# Patient Record
Sex: Female | Born: 1975 | Hispanic: Yes | Marital: Single | Smoking: Never smoker
Health system: Southern US, Community
[De-identification: ages and names within clinical notes are randomized; demographics above are authoritative.]

## PROBLEM LIST (undated history)

## (undated) DIAGNOSIS — S82202A Unspecified fracture of shaft of left tibia, initial encounter for closed fracture: Secondary | ICD-10-CM

## (undated) DIAGNOSIS — S82832A Other fracture of upper and lower end of left fibula, initial encounter for closed fracture: Secondary | ICD-10-CM

## (undated) DIAGNOSIS — S82872A Displaced pilon fracture of left tibia, initial encounter for closed fracture: Secondary | ICD-10-CM

---

## 2016-10-21 ENCOUNTER — Emergency Department (HOSPITAL_COMMUNITY): Payer: Medicaid Other

## 2016-10-21 ENCOUNTER — Encounter (HOSPITAL_COMMUNITY): Payer: Self-pay

## 2016-10-21 ENCOUNTER — Emergency Department (HOSPITAL_COMMUNITY)
Admission: EM | Admit: 2016-10-21 | Discharge: 2016-10-22 | Disposition: A | Discharge status: Home / Self Care | Payer: Medicaid Other | Attending: Emergency Medicine | Admitting: Emergency Medicine

## 2016-10-21 DIAGNOSIS — Y9355 Activity, bike riding: Secondary | ICD-10-CM | POA: Insufficient documentation

## 2016-10-21 DIAGNOSIS — Y929 Unspecified place or not applicable: Secondary | ICD-10-CM | POA: Diagnosis not present

## 2016-10-21 DIAGNOSIS — Y999 Unspecified external cause status: Secondary | ICD-10-CM | POA: Diagnosis not present

## 2016-10-21 DIAGNOSIS — S8992XA Unspecified injury of left lower leg, initial encounter: Secondary | ICD-10-CM | POA: Diagnosis present

## 2016-10-21 DIAGNOSIS — S82202A Unspecified fracture of shaft of left tibia, initial encounter for closed fracture: Secondary | ICD-10-CM | POA: Insufficient documentation

## 2016-10-21 DIAGNOSIS — S82402A Unspecified fracture of shaft of left fibula, initial encounter for closed fracture: Secondary | ICD-10-CM | POA: Diagnosis not present

## 2016-10-21 MED ORDER — OXYCODONE-ACETAMINOPHEN 5-325 MG PO TABS
2.0000 | ORAL_TABLET | Freq: Once | ORAL | Status: AC
  Administered 2016-10-22: 2 via ORAL
  Filled 2016-10-21: qty 2

## 2016-10-21 MED ORDER — FENTANYL CITRATE (PF) 100 MCG/2ML IJ SOLN
50.0000 ug | INTRAMUSCULAR | Status: DC | PRN
  Administered 2016-10-21: 50 ug via INTRAVENOUS
  Filled 2016-10-21: qty 2

## 2016-10-21 MED ORDER — OXYCODONE-ACETAMINOPHEN 5-325 MG PO TABS: 1.0000 | ORAL_TABLET | ORAL | 0 refills | Status: DC | PRN

## 2016-10-21 NOTE — ED Notes (Signed)
Bed: [id number] Expected date:  Expected time:  Means of arrival:  Comments: [age] f with fx and deformity
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
## 2016-10-21 NOTE — Discharge Instructions (Signed)
Do not bear weight on your left leg. Deep leg elevated to reduce swelling. Follow up with Dr. Valentina GuLucy in his clinic at 7 AM on Tuesday. Return immediately for uncontrolled pain, numbness or for any concerns.

## 2016-10-22 MED ORDER — OXYCODONE-ACETAMINOPHEN 5-325 MG PO TABS: 1.0000 | ORAL_TABLET | ORAL | 0 refills | Status: DC | PRN

## 2016-10-24 ENCOUNTER — Encounter (HOSPITAL_COMMUNITY): Payer: Self-pay | Admitting: *Deleted

## 2016-10-24 NOTE — Progress Notes (Signed)
I completed SDW call using [employer] interpreter, [place], [place] # [id number].

## 2016-10-26 ENCOUNTER — Encounter (HOSPITAL_COMMUNITY): Payer: Self-pay | Admitting: General Practice

## 2016-10-26 ENCOUNTER — Encounter (HOSPITAL_COMMUNITY)
Admission: RE | Disposition: A | Discharge status: Home Health | Payer: Self-pay | Source: Ambulatory Visit | Attending: Orthopedic Surgery

## 2016-10-26 ENCOUNTER — Inpatient Hospital Stay (HOSPITAL_COMMUNITY): Payer: Self-pay | Admitting: Anesthesiology

## 2016-10-26 ENCOUNTER — Inpatient Hospital Stay (HOSPITAL_COMMUNITY)
Admission: RE | Admit: 2016-10-26 | Discharge: 2016-10-28 | DRG: 493 | Disposition: A | Discharge status: Home Health | Payer: Self-pay | Source: Ambulatory Visit | Attending: Orthopedic Surgery | Admitting: Orthopedic Surgery

## 2016-10-26 ENCOUNTER — Inpatient Hospital Stay (HOSPITAL_COMMUNITY): Payer: Self-pay

## 2016-10-26 DIAGNOSIS — Z419 Encounter for procedure for purposes other than remedying health state, unspecified: Secondary | ICD-10-CM

## 2016-10-26 DIAGNOSIS — S82832A Other fracture of upper and lower end of left fibula, initial encounter for closed fracture: Secondary | ICD-10-CM

## 2016-10-26 DIAGNOSIS — T148XXA Other injury of unspecified body region, initial encounter: Secondary | ICD-10-CM

## 2016-10-26 DIAGNOSIS — S82872A Displaced pilon fracture of left tibia, initial encounter for closed fracture: Principal | ICD-10-CM | POA: Diagnosis present

## 2016-10-26 DIAGNOSIS — S82202A Unspecified fracture of shaft of left tibia, initial encounter for closed fracture: Secondary | ICD-10-CM

## 2016-10-26 DIAGNOSIS — D62 Acute posthemorrhagic anemia: Secondary | ICD-10-CM | POA: Diagnosis not present

## 2016-10-26 HISTORY — DX: Unspecified fracture of shaft of left tibia, initial encounter for closed fracture: S82.202A

## 2016-10-26 HISTORY — DX: Other fracture of upper and lower end of left fibula, initial encounter for closed fracture: S82.832A

## 2016-10-26 HISTORY — DX: Displaced pilon fracture of left tibia, initial encounter for closed fracture: S82.832A

## 2016-10-26 HISTORY — DX: Displaced pilon fracture of left tibia, initial encounter for closed fracture: S82.872A

## 2016-10-26 HISTORY — PX: TIBIA IM NAIL INSERTION: SHX2516

## 2016-10-26 HISTORY — PX: ORIF TIBIA FRACTURE: SHX5416

## 2016-10-26 LAB — CBC WITH DIFFERENTIAL/PLATELET
BASOS PCT: 0 %
Basophils Absolute: 0 10*3/uL (ref 0.0–0.1)
Eosinophils Absolute: 0.7 10*3/uL (ref 0.0–0.7)
Eosinophils Relative: 6 %
HEMATOCRIT: 38.1 % (ref 36.0–46.0)
HEMOGLOBIN: 12.5 g/dL (ref 12.0–15.0)
LYMPHS ABS: 2.1 10*3/uL (ref 0.7–4.0)
LYMPHS PCT: 18 %
MCH: 26.8 pg (ref 26.0–34.0)
MCHC: 32.8 g/dL (ref 30.0–36.0)
MCV: 81.8 fL (ref 78.0–100.0)
MONO ABS: 0.7 10*3/uL (ref 0.1–1.0)
MONOS PCT: 6 %
NEUTROS ABS: 8.3 10*3/uL — AB (ref 1.7–7.7)
NEUTROS PCT: 70 %
Platelets: 364 10*3/uL (ref 150–400)
RBC: 4.66 MIL/uL (ref 3.87–5.11)
RDW: 13.7 % (ref 11.5–15.5)
WBC: 11.8 10*3/uL — ABNORMAL HIGH (ref 4.0–10.5)

## 2016-10-26 LAB — SURGICAL PCR SCREEN
MRSA, PCR: NEGATIVE
Staphylococcus aureus: POSITIVE — AB

## 2016-10-26 LAB — URINALYSIS, ROUTINE W REFLEX MICROSCOPIC
Bilirubin Urine: NEGATIVE
Glucose, UA: NEGATIVE mg/dL
KETONES UR: 5 mg/dL — AB
Leukocytes, UA: NEGATIVE
Nitrite: NEGATIVE
PH: 6 (ref 5.0–8.0)
PROTEIN: 100 mg/dL — AB
Specific Gravity, Urine: 1.028 (ref 1.005–1.030)

## 2016-10-26 LAB — PROTIME-INR
INR: 1.08
PROTHROMBIN TIME: 14 s (ref 11.4–15.2)

## 2016-10-26 LAB — COMPREHENSIVE METABOLIC PANEL
ALBUMIN: 3.9 g/dL (ref 3.5–5.0)
ALK PHOS: 98 U/L (ref 38–126)
ALT: 20 U/L (ref 14–54)
ANION GAP: 8 (ref 5–15)
AST: 17 U/L (ref 15–41)
BILIRUBIN TOTAL: 0.8 mg/dL (ref 0.3–1.2)
BUN: 13 mg/dL (ref 6–20)
CALCIUM: 9.1 mg/dL (ref 8.9–10.3)
CO2: 24 mmol/L (ref 22–32)
Chloride: 105 mmol/L (ref 101–111)
Creatinine, Ser: 0.57 mg/dL (ref 0.44–1.00)
GFR calc Af Amer: >60 mL/min (ref >60)
GLUCOSE: 95 mg/dL (ref 65–99)
POTASSIUM: 4.3 mmol/L (ref 3.5–5.1)
Sodium: 137 mmol/L (ref 135–145)
TOTAL PROTEIN: 7.6 g/dL (ref 6.5–8.1)

## 2016-10-26 LAB — ABO/RH: ABO/RH(D): B POS

## 2016-10-26 LAB — TYPE AND SCREEN
ABO/RH(D): B POS
ANTIBODY SCREEN: NEGATIVE

## 2016-10-26 LAB — HCG, SERUM, QUALITATIVE: PREG SERUM: NEGATIVE

## 2016-10-26 LAB — APTT: APTT: 32 s (ref 24–36)

## 2016-10-26 SURGERY — OPEN REDUCTION INTERNAL FIXATION (ORIF) TIBIA FRACTURE
Anesthesia: General | Site: Leg Upper | Laterality: Left

## 2016-10-26 MED ORDER — HYDROMORPHONE HCL 1 MG/ML IJ SOLN
INTRAMUSCULAR | Status: AC
  Filled 2016-10-26: qty 0.5

## 2016-10-26 MED ORDER — METOCLOPRAMIDE HCL 5 MG/ML IJ SOLN: 5.0000 mg | Freq: Three times a day (TID) | INTRAMUSCULAR | Status: DC | PRN

## 2016-10-26 MED ORDER — POTASSIUM CHLORIDE IN NACL 20-0.9 MEQ/L-% IV SOLN
INTRAVENOUS | Status: DC
  Administered 2016-10-26 – 2016-10-28 (×3): via INTRAVENOUS
  Filled 2016-10-26 (×3): qty 1000

## 2016-10-26 MED ORDER — ACETAMINOPHEN 10 MG/ML IV SOLN: 1000.0000 mg | Freq: Four times a day (QID) | INTRAVENOUS | Status: DC

## 2016-10-26 MED ORDER — METHOCARBAMOL 500 MG PO TABS
500.0000 mg | ORAL_TABLET | Freq: Four times a day (QID) | ORAL | Status: DC | PRN
  Administered 2016-10-26 – 2016-10-28 (×2): 1000 mg via ORAL
  Filled 2016-10-26 (×2): qty 2

## 2016-10-26 MED ORDER — ROCURONIUM BROMIDE 50 MG/5ML IV SOLN
INTRAVENOUS | Status: AC
  Filled 2016-10-26: qty 1

## 2016-10-26 MED ORDER — CEFAZOLIN SODIUM-DEXTROSE 1-4 GM/50ML-% IV SOLN
1.0000 g | Freq: Four times a day (QID) | INTRAVENOUS | Status: AC
  Administered 2016-10-27 (×3): 1 g via INTRAVENOUS
  Filled 2016-10-26 (×3): qty 50

## 2016-10-26 MED ORDER — BISACODYL 5 MG PO TBEC: 5.0000 mg | DELAYED_RELEASE_TABLET | Freq: Every day | ORAL | Status: DC | PRN

## 2016-10-26 MED ORDER — CEFAZOLIN SODIUM-DEXTROSE 2-4 GM/100ML-% IV SOLN
2.0000 g | INTRAVENOUS | Status: DC
  Filled 2016-10-26: qty 100

## 2016-10-26 MED ORDER — CEFAZOLIN SODIUM-DEXTROSE 2-4 GM/100ML-% IV SOLN
INTRAVENOUS | Status: AC
  Filled 2016-10-26: qty 100

## 2016-10-26 MED ORDER — ACETAMINOPHEN 650 MG RE SUPP: 650.0000 mg | Freq: Four times a day (QID) | RECTAL | Status: DC | PRN

## 2016-10-26 MED ORDER — MIDAZOLAM HCL 2 MG/2ML IJ SOLN
INTRAMUSCULAR | Status: DC | PRN
  Administered 2016-10-26: 2 mg via INTRAVENOUS

## 2016-10-26 MED ORDER — HYDROCODONE-ACETAMINOPHEN 7.5-325 MG PO TABS
1.0000 | ORAL_TABLET | ORAL | Status: DC | PRN
  Administered 2016-10-26: 2 via ORAL
  Administered 2016-10-27: 1 via ORAL
  Administered 2016-10-27 – 2016-10-28 (×6): 2 via ORAL
  Filled 2016-10-26 (×2): qty 2
  Filled 2016-10-26: qty 1
  Filled 2016-10-26 (×5): qty 2

## 2016-10-26 MED ORDER — LIDOCAINE 2% (20 MG/ML) 5 ML SYRINGE
INTRAMUSCULAR | Status: DC | PRN
  Administered 2016-10-26: 60 mg via INTRAVENOUS

## 2016-10-26 MED ORDER — PROPOFOL 10 MG/ML IV BOLUS
INTRAVENOUS | Status: AC
  Filled 2016-10-26: qty 20

## 2016-10-26 MED ORDER — ENOXAPARIN SODIUM 40 MG/0.4ML ~~LOC~~ SOLN
40.0000 mg | SUBCUTANEOUS | Status: DC
  Administered 2016-10-27 – 2016-10-28 (×2): 40 mg via SUBCUTANEOUS
  Filled 2016-10-26 (×2): qty 0.4

## 2016-10-26 MED ORDER — PROPOFOL 10 MG/ML IV BOLUS
INTRAVENOUS | Status: DC | PRN
  Administered 2016-10-26: 150 mg via INTRAVENOUS

## 2016-10-26 MED ORDER — METOCLOPRAMIDE HCL 5 MG PO TABS: 5.0000 mg | ORAL_TABLET | Freq: Three times a day (TID) | ORAL | Status: DC | PRN

## 2016-10-26 MED ORDER — DOCUSATE SODIUM 100 MG PO CAPS
100.0000 mg | ORAL_CAPSULE | Freq: Two times a day (BID) | ORAL | Status: DC
  Administered 2016-10-26 – 2016-10-28 (×4): 100 mg via ORAL
  Filled 2016-10-26 (×4): qty 1

## 2016-10-26 MED ORDER — LACTATED RINGERS IV SOLN
INTRAVENOUS | Status: DC
Start: 2016-10-26 — End: 2016-10-26
  Administered 2016-10-26 (×2): via INTRAVENOUS

## 2016-10-26 MED ORDER — ONDANSETRON HCL 4 MG/2ML IJ SOLN: 4.0000 mg | Freq: Four times a day (QID) | INTRAMUSCULAR | Status: DC | PRN

## 2016-10-26 MED ORDER — CHLORHEXIDINE GLUCONATE CLOTH 2 % EX PADS: 6.0000 | MEDICATED_PAD | Freq: Every day | CUTANEOUS | Status: DC

## 2016-10-26 MED ORDER — FENTANYL CITRATE (PF) 250 MCG/5ML IJ SOLN
INTRAMUSCULAR | Status: AC
  Filled 2016-10-26: qty 5

## 2016-10-26 MED ORDER — MUPIROCIN 2 % EX OINT: 1.0000 "application " | TOPICAL_OINTMENT | Freq: Two times a day (BID) | CUTANEOUS | Status: DC

## 2016-10-26 MED ORDER — OXYCODONE HCL 5 MG PO TABS: 5.0000 mg | ORAL_TABLET | Freq: Once | ORAL | Status: DC | PRN

## 2016-10-26 MED ORDER — HYDROMORPHONE HCL 1 MG/ML IJ SOLN
1.0000 mg | INTRAMUSCULAR | Status: DC | PRN
  Administered 2016-10-27 (×4): 1 mg via INTRAVENOUS
  Filled 2016-10-26 (×4): qty 1

## 2016-10-26 MED ORDER — MIDAZOLAM HCL 2 MG/2ML IJ SOLN
INTRAMUSCULAR | Status: AC
  Filled 2016-10-26: qty 2

## 2016-10-26 MED ORDER — DEXAMETHASONE SODIUM PHOSPHATE 10 MG/ML IJ SOLN
INTRAMUSCULAR | Status: AC
  Filled 2016-10-26: qty 1

## 2016-10-26 MED ORDER — ROCURONIUM BROMIDE 100 MG/10ML IV SOLN
INTRAVENOUS | Status: DC | PRN
  Administered 2016-10-26: 10 mg via INTRAVENOUS
  Administered 2016-10-26: 50 mg via INTRAVENOUS
  Administered 2016-10-26: 20 mg via INTRAVENOUS

## 2016-10-26 MED ORDER — CHLORHEXIDINE GLUCONATE 4 % EX LIQD: 60.0000 mL | Freq: Once | CUTANEOUS | Status: DC

## 2016-10-26 MED ORDER — ONDANSETRON HCL 4 MG PO TABS: 4.0000 mg | ORAL_TABLET | Freq: Four times a day (QID) | ORAL | Status: DC | PRN

## 2016-10-26 MED ORDER — ONDANSETRON HCL 4 MG/2ML IJ SOLN: 4.0000 mg | Freq: Once | INTRAMUSCULAR | Status: DC | PRN

## 2016-10-26 MED ORDER — OXYCODONE HCL 5 MG PO TABS: 5.0000 mg | ORAL_TABLET | Freq: Four times a day (QID) | ORAL | Status: DC | PRN

## 2016-10-26 MED ORDER — HYDROMORPHONE HCL 1 MG/ML IJ SOLN
0.2500 mg | INTRAMUSCULAR | Status: DC | PRN
  Administered 2016-10-26: 0.5 mg via INTRAVENOUS

## 2016-10-26 MED ORDER — POLYETHYLENE GLYCOL 3350 17 G PO PACK
17.0000 g | PACK | Freq: Every day | ORAL | Status: DC
  Administered 2016-10-27 – 2016-10-28 (×2): 17 g via ORAL
  Filled 2016-10-26 (×2): qty 1

## 2016-10-26 MED ORDER — ONDANSETRON HCL 4 MG/2ML IJ SOLN
INTRAMUSCULAR | Status: AC
  Filled 2016-10-26: qty 2

## 2016-10-26 MED ORDER — OXYCODONE HCL 5 MG/5ML PO SOLN: 5.0000 mg | Freq: Once | ORAL | Status: DC | PRN

## 2016-10-26 MED ORDER — LACTATED RINGERS IV SOLN: INTRAVENOUS | Status: DC

## 2016-10-26 MED ORDER — METHOCARBAMOL 1000 MG/10ML IJ SOLN
1000.0000 mg | Freq: Four times a day (QID) | INTRAVENOUS | Status: DC | PRN
  Filled 2016-10-26: qty 10

## 2016-10-26 MED ORDER — ACETAMINOPHEN 325 MG PO TABS: 650.0000 mg | ORAL_TABLET | Freq: Four times a day (QID) | ORAL | Status: DC | PRN

## 2016-10-26 MED ORDER — SUGAMMADEX SODIUM 200 MG/2ML IV SOLN
INTRAVENOUS | Status: AC
  Filled 2016-10-26: qty 2

## 2016-10-26 MED ORDER — DEXAMETHASONE SODIUM PHOSPHATE 10 MG/ML IJ SOLN
INTRAMUSCULAR | Status: DC | PRN
  Administered 2016-10-26: 10 mg via INTRAVENOUS

## 2016-10-26 MED ORDER — CEFAZOLIN SODIUM-DEXTROSE 2-3 GM-% IV SOLR
INTRAVENOUS | Status: DC | PRN
  Administered 2016-10-26: 2 g via INTRAVENOUS

## 2016-10-26 MED ORDER — PHENYLEPHRINE 40 MCG/ML (10ML) SYRINGE FOR IV PUSH (FOR BLOOD PRESSURE SUPPORT)
PREFILLED_SYRINGE | INTRAVENOUS | Status: AC
Start: 2016-10-26 — End: 2016-10-26
  Filled 2016-10-26: qty 10

## 2016-10-26 MED ORDER — FENTANYL CITRATE (PF) 100 MCG/2ML IJ SOLN
INTRAMUSCULAR | Status: DC | PRN
  Administered 2016-10-26: 100 ug via INTRAVENOUS
  Administered 2016-10-26 (×3): 50 ug via INTRAVENOUS

## 2016-10-26 MED ORDER — 0.9 % SODIUM CHLORIDE (POUR BTL) OPTIME
TOPICAL | Status: DC | PRN
  Administered 2016-10-26: 1000 mL

## 2016-10-26 MED ORDER — LIDOCAINE HCL (CARDIAC) 20 MG/ML IV SOLN
INTRAVENOUS | Status: AC
  Filled 2016-10-26: qty 5

## 2016-10-26 MED ORDER — SUGAMMADEX SODIUM 200 MG/2ML IV SOLN
INTRAVENOUS | Status: DC | PRN
  Administered 2016-10-26: 200 mg via INTRAVENOUS

## 2016-10-26 SURGICAL SUPPLY — 87 items
BANDAGE ACE 4X5 VEL STRL LF (GAUZE/BANDAGES/DRESSINGS) ×3 IMPLANT
BANDAGE ACE 6X5 VEL STRL LF (GAUZE/BANDAGES/DRESSINGS) ×3 IMPLANT
BANDAGE ESMARK 6X9 LF (GAUZE/BANDAGES/DRESSINGS) ×1 IMPLANT
BIT DRILL 2.5X2.75 QC CALB (BIT) ×3 IMPLANT
BIT DRILL 3.5X5.5 QC CALB (BIT) ×3 IMPLANT
BIT DRILL CALIBRATED 4.3X320MM (BIT) ×1 IMPLANT
BIT DRILL CROWE POINT TWST 4.3 (DRILL) ×1 IMPLANT
BLADE CLIPPER SURG (BLADE) IMPLANT
BLADE SURG 10 STRL SS (BLADE) ×6 IMPLANT
BNDG COHESIVE 4X5 TAN STRL (GAUZE/BANDAGES/DRESSINGS) IMPLANT
BNDG ESMARK 6X9 LF (GAUZE/BANDAGES/DRESSINGS) ×3
BNDG GAUZE ELAST 4 BULKY (GAUZE/BANDAGES/DRESSINGS) ×6 IMPLANT
BRUSH SCRUB SURG 4.25 DISP (MISCELLANEOUS) ×6 IMPLANT
CLOSURE WOUND 1/2 X4 (GAUZE/BANDAGES/DRESSINGS) ×1
COVER MAYO STAND STRL (DRAPES) ×3 IMPLANT
COVER SURGICAL LIGHT HANDLE (MISCELLANEOUS) ×6 IMPLANT
DRAPE C-ARM 42X72 X-RAY (DRAPES) ×3 IMPLANT
DRAPE C-ARMOR (DRAPES) ×3 IMPLANT
DRAPE HALF SHEET 40X57 (DRAPES) ×6 IMPLANT
DRAPE INCISE IOBAN 66X45 STRL (DRAPES) ×3 IMPLANT
DRAPE U-SHAPE 47X51 STRL (DRAPES) ×3 IMPLANT
DRILL CALIBRATED 4.3X320MM (BIT) ×3
DRILL CROWE POINT TWIST 4.3 (DRILL) ×3
DRSG ADAPTIC 3X8 NADH LF (GAUZE/BANDAGES/DRESSINGS) ×3 IMPLANT
DRSG MEPITEL 4X7.2 (GAUZE/BANDAGES/DRESSINGS) ×3 IMPLANT
DRSG PAD ABDOMINAL 8X10 ST (GAUZE/BANDAGES/DRESSINGS) ×6 IMPLANT
ELECT REM PT RETURN 9FT ADLT (ELECTROSURGICAL) ×3
ELECTRODE REM PT RTRN 9FT ADLT (ELECTROSURGICAL) ×1 IMPLANT
GAUZE SPONGE 4X4 12PLY STRL (GAUZE/BANDAGES/DRESSINGS) ×3 IMPLANT
GAUZE SPONGE 4X4 12PLY STRL LF (GAUZE/BANDAGES/DRESSINGS) ×3 IMPLANT
GLOVE BIO SURGEON STRL SZ7.5 (GLOVE) ×3 IMPLANT
GLOVE BIO SURGEON STRL SZ8 (GLOVE) ×3 IMPLANT
GLOVE BIO SURGEON STRL SZ8.5 (GLOVE) ×3 IMPLANT
GLOVE BIOGEL PI IND STRL 7.5 (GLOVE) ×1 IMPLANT
GLOVE BIOGEL PI IND STRL 8 (GLOVE) ×1 IMPLANT
GLOVE BIOGEL PI INDICATOR 7.5 (GLOVE) ×2
GLOVE BIOGEL PI INDICATOR 8 (GLOVE) ×2
GLOVE PROGUARD SZ 7 1/2 (GLOVE) ×3 IMPLANT
GLOVE XGUARD RR 2 7.5 (GLOVE) ×1 IMPLANT
GLOVE XGUARD RR2 7.5 (GLOVE) ×2
GOWN STRL REUS W/ TWL LRG LVL3 (GOWN DISPOSABLE) ×2 IMPLANT
GOWN STRL REUS W/ TWL XL LVL3 (GOWN DISPOSABLE) ×1 IMPLANT
GOWN STRL REUS W/TWL LRG LVL3 (GOWN DISPOSABLE) ×4
GOWN STRL REUS W/TWL XL LVL3 (GOWN DISPOSABLE) ×2
GUIDEWIRE 2.6X80 BEAD TIP (WIRE) ×1 IMPLANT
GUIDWIRE 2.6X80 BEAD TIP (WIRE) ×3
KIT BASIN OR (CUSTOM PROCEDURE TRAY) ×3 IMPLANT
KIT ROOM TURNOVER OR (KITS) ×3 IMPLANT
MANIFOLD NEPTUNE II (INSTRUMENTS) ×3 IMPLANT
NAIL TIBIAL PHOENIX 9.0X320MM (Nail) ×3 IMPLANT
NS IRRIG 1000ML POUR BTL (IV SOLUTION) ×3 IMPLANT
PACK ORTHO EXTREMITY (CUSTOM PROCEDURE TRAY) ×3 IMPLANT
PAD ARMBOARD 7.5X6 YLW CONV (MISCELLANEOUS) ×6 IMPLANT
PAD CAST 4YDX4 CTTN HI CHSV (CAST SUPPLIES) ×1 IMPLANT
PADDING CAST ABS 4INX4YD NS (CAST SUPPLIES) ×2
PADDING CAST ABS 6INX4YD NS (CAST SUPPLIES) ×2
PADDING CAST ABS COTTON 4X4 ST (CAST SUPPLIES) ×1 IMPLANT
PADDING CAST ABS COTTON 6X4 NS (CAST SUPPLIES) ×1 IMPLANT
PADDING CAST COTTON 4X4 STRL (CAST SUPPLIES) ×2
PADDING CAST COTTON 6X4 STRL (CAST SUPPLIES) ×3 IMPLANT
SCREW CORT TI DBL LEAD 5X34 (Screw) ×3 IMPLANT
SCREW CORT TI DBL LEAD 5X36 (Screw) ×3 IMPLANT
SCREW CORT TI DBL LEAD 5X56 (Screw) ×3 IMPLANT
SCREW CORT TI DBLE LEAD 5X28 (Screw) ×3 IMPLANT
SCREW CORTICAL 3.5MM  46MM (Screw) ×2 IMPLANT
SCREW CORTICAL 3.5MM 38MM (Screw) ×3 IMPLANT
SCREW CORTICAL 3.5MM 46MM (Screw) ×1 IMPLANT
SPONGE LAP 18X18 X RAY DECT (DISPOSABLE) IMPLANT
STAPLER VISISTAT 35W (STAPLE) ×3 IMPLANT
STOCKINETTE IMPERVIOUS LG (DRAPES) IMPLANT
STRIP CLOSURE SKIN 1/2X4 (GAUZE/BANDAGES/DRESSINGS) ×2 IMPLANT
SUCTION FRAZIER HANDLE 10FR (MISCELLANEOUS) ×2
SUCTION TUBE FRAZIER 10FR DISP (MISCELLANEOUS) ×1 IMPLANT
SUT ETHILON 3 0 PS 1 (SUTURE) ×6 IMPLANT
SUT VIC AB 0 CT1 27 (SUTURE) ×4
SUT VIC AB 0 CT1 27XBRD ANBCTR (SUTURE) ×2 IMPLANT
SUT VIC AB 1 CT1 27 (SUTURE) ×2
SUT VIC AB 1 CT1 27XBRD ANBCTR (SUTURE) ×1 IMPLANT
SUT VIC AB 2-0 CT1 27 (SUTURE) ×6
SUT VIC AB 2-0 CT1 TAPERPNT 27 (SUTURE) ×3 IMPLANT
TOWEL OR 17X24 6PK STRL BLUE (TOWEL DISPOSABLE) ×3 IMPLANT
TOWEL OR 17X26 10 PK STRL BLUE (TOWEL DISPOSABLE) ×6 IMPLANT
TRAY FOLEY W/METER SILVER 16FR (SET/KITS/TRAYS/PACK) IMPLANT
TUBE CONNECTING 12'X1/4 (SUCTIONS) ×1
TUBE CONNECTING 12X1/4 (SUCTIONS) ×2 IMPLANT
WATER STERILE IRR 1000ML POUR (IV SOLUTION) ×6 IMPLANT
YANKAUER SUCT BULB TIP NO VENT (SUCTIONS) ×3 IMPLANT

## 2016-10-26 NOTE — Transfer of Care (Signed)
Immediate Anesthesia Transfer of Care Note  Patient: Chelsea Lane  Procedure(s) Performed: Procedure(s): OPEN REDUCTION INTERNAL FIXATION (ORIF) LEFT TIBIA FRACTURE (Left) INTRAMEDULLARY (IM) NAIL LEFT TIBIAL (Left)  Patient Location: PACU  Anesthesia Type:General  Level of Consciousness: awake, alert  and oriented  Airway & Oxygen Therapy: Patient Spontanous Breathing  Post-op Assessment: Report given to RN and Post -op Vital signs reviewed and stable  Post vital signs: Reviewed and stable  Last Vitals:  Vitals:   10/26/16 1012 10/26/16 1532  BP: (!) 149/58 117/74  Pulse: 77 79  Resp: 18 16  Temp: 37.1 C 37.6 C    Last Pain:  Vitals:   10/26/16 1532  TempSrc: Oral  PainSc:       Patients Stated Pain Goal: 2 (10/26/16 1140)  Complications: No apparent anesthesia complications

## 2016-10-26 NOTE — Anesthesia Postprocedure Evaluation (Signed)
Anesthesia Post Note  Patient: Chelsea Lane  Procedure(s) Performed: Procedure(s) (LRB): OPEN REDUCTION INTERNAL FIXATION (ORIF) LEFT TIBIA FRACTURE (Left) INTRAMEDULLARY (IM) NAIL LEFT TIBIAL (Left)     Patient location during evaluation: PACU Anesthesia Type: General Level of consciousness: awake and alert Pain management: pain level controlled Vital Signs Assessment: post-procedure vital signs reviewed and stable Respiratory status: spontaneous breathing, nonlabored ventilation, respiratory function stable and patient connected to nasal cannula oxygen Cardiovascular status: blood pressure returned to baseline and stable Postop Assessment: no signs of nausea or vomiting Anesthetic complications: no    Last Vitals:  Vitals:   10/26/16 2149 10/26/16 2205  BP: 131/88 (!) 143/80  Pulse: (!) 111 (!) 115  Resp: (!) 24 20  Temp: (!) 36.4 C 37.5 C    Last Pain:  Vitals:   10/26/16 2149  TempSrc:   PainSc: Asleep                 Geraldyn Shain S

## 2016-10-26 NOTE — Anesthesia Procedure Notes (Signed)
Procedure Name: Intubation Date/Time: 10/26/2016 6:43 PM Performed by: Manuela Schwartz B Pre-anesthesia Checklist: Patient identified, Emergency Drugs available, Suction available, Patient being monitored and Timeout performed Patient Re-evaluated:Patient Re-evaluated prior to induction Oxygen Delivery Method: Circle system utilized Preoxygenation: Pre-oxygenation with 100% oxygen Induction Type: IV induction Ventilation: Mask ventilation without difficulty Laryngoscope Size: Mac and 3 Grade View: Grade I Tube type: Oral Tube size: 7.0 mm Number of attempts: 1 Airway Equipment and Method: Stylet and Oral airway Placement Confirmation: ETT inserted through vocal cords under direct vision,  positive ETCO2 and breath sounds checked- equal and bilateral Secured at: 21 cm Tube secured with: Tape Dental Injury: Teeth and Oropharynx as per pre-operative assessment

## 2016-10-26 NOTE — Anesthesia Preprocedure Evaluation (Signed)
Anesthesia Evaluation  Patient identified by MRN, date of birth, ID band Patient awake    Reviewed: Allergy & Precautions, NPO status , Patient's Chart, lab work & pertinent test results  Airway Mallampati: II  TM Distance: >3 FB Neck ROM: Full    Dental  (+) Teeth Intact, Dental Advisory Given   Pulmonary    breath sounds clear to auscultation       Cardiovascular  Rhythm:Regular Rate:Normal     Neuro/Psych    GI/Hepatic   Endo/Other    Renal/GU      Musculoskeletal   Abdominal   Peds  Hematology   Anesthesia Other Findings   Reproductive/Obstetrics                             Anesthesia Physical Anesthesia Plan  ASA: I  Anesthesia Plan: General   Post-op Pain Management:    Induction: Intravenous  PONV Risk Score and Plan: Dexamethasone and Ondansetron  Airway Management Planned: Oral ETT  Additional Equipment:   Intra-op Plan:   Post-operative Plan: Extubation in OR  Informed Consent: I have reviewed the patients History and Physical, chart, labs and discussed the procedure including the risks, benefits and alternatives for the proposed anesthesia with the patient or authorized representative who has indicated his/her understanding and acceptance.   Dental advisory given  Plan Discussed with: CRNA and Anesthesiologist  Anesthesia Plan Comments:         Anesthesia Quick Evaluation
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
## 2016-10-26 NOTE — Progress Notes (Signed)
[facility]  Patient transported to floor with nurse and son.

## 2016-10-27 ENCOUNTER — Encounter (HOSPITAL_COMMUNITY): Payer: Self-pay | Admitting: Orthopedic Surgery

## 2016-10-27 LAB — BASIC METABOLIC PANEL
ANION GAP: 6 (ref 5–15)
BUN: 11 mg/dL (ref 6–20)
CALCIUM: 8.6 mg/dL — AB (ref 8.9–10.3)
CHLORIDE: 106 mmol/L (ref 101–111)
CO2: 24 mmol/L (ref 22–32)
CREATININE: 0.54 mg/dL (ref 0.44–1.00)
GFR calc Af Amer: >60 mL/min (ref >60)
GFR calc non Af Amer: >60 mL/min (ref >60)
GLUCOSE: 177 mg/dL — AB (ref 65–99)
Potassium: 4.2 mmol/L (ref 3.5–5.1)
Sodium: 136 mmol/L (ref 135–145)

## 2016-10-27 LAB — CBC
HCT: 34.7 % — ABNORMAL LOW (ref 36.0–46.0)
HEMOGLOBIN: 11.1 g/dL — AB (ref 12.0–15.0)
MCH: 25.7 pg — AB (ref 26.0–34.0)
MCHC: 32 g/dL (ref 30.0–36.0)
MCV: 80.3 fL (ref 78.0–100.0)
Platelets: 382 10*3/uL (ref 150–400)
RBC: 4.32 MIL/uL (ref 3.87–5.11)
RDW: 13.5 % (ref 11.5–15.5)
WBC: 12.1 10*3/uL — ABNORMAL HIGH (ref 4.0–10.5)

## 2016-10-27 MED ORDER — OXYCODONE HCL 5 MG PO TABS
5.0000 mg | ORAL_TABLET | Freq: Four times a day (QID) | ORAL | 0 refills | Status: AC | PRN
Start: 2016-10-27 — End: ?

## 2016-10-27 MED ORDER — ENOXAPARIN SODIUM 40 MG/0.4ML ~~LOC~~ SOLN: 40.0000 mg | SUBCUTANEOUS | 0 refills | Status: AC

## 2016-10-27 MED ORDER — ENOXAPARIN (LOVENOX) PATIENT EDUCATION KIT: 1.0000 | PACK | Freq: Once | 0 refills | Status: AC

## 2016-10-27 MED ORDER — METHOCARBAMOL 500 MG PO TABS: 500.0000 mg | ORAL_TABLET | Freq: Four times a day (QID) | ORAL | 0 refills | Status: AC | PRN

## 2016-10-27 MED ORDER — POLYETHYLENE GLYCOL 3350 17 G PO PACK: 17.0000 g | PACK | Freq: Every day | ORAL | 0 refills | Status: AC

## 2016-10-27 MED ORDER — DOCUSATE SODIUM 100 MG PO CAPS: 100.0000 mg | ORAL_CAPSULE | Freq: Two times a day (BID) | ORAL | 0 refills | Status: AC

## 2016-10-27 MED ORDER — OXYCODONE-ACETAMINOPHEN 5-325 MG PO TABS: 1.0000 | ORAL_TABLET | Freq: Four times a day (QID) | ORAL | 0 refills | Status: AC | PRN

## 2016-10-27 NOTE — Discharge Instructions (Signed)
Orthopaedic Trauma Service Discharge Instructions   General Discharge Instructions  WEIGHT BEARING STATUS: Nonweightbearing left leg   RANGE OF MOTION/ACTIVITY: unrestricted range of motion left knee and hip. You are unable to move your ankle as you are in a splint. Ok to move toes  Wound Care: do not remove splint. Keep splint clean and dry. We will remove splint at first follow up appointment   PAIN MEDICATION USE AND EXPECTATIONS  You have likely been given narcotic medications to help control your pain.  After a traumatic event that results in an fracture (broken bone) with or without surgery, it is ok to use narcotic pain medications to help control one's pain.  We understand that everyone responds to pain differently and each individual patient will be evaluated on a regular basis for the continued need for narcotic medications. Ideally, narcotic medication use should last no more than 6-8 weeks (coinciding with fracture healing).   As a patient it is your responsibility as well to monitor narcotic medication use and report the amount and frequency you use these medications when you come to your office visit.   We would also advise that if you are using narcotic medications, you should take a dose prior to therapy to maximize you participation.  IF YOU ARE ON NARCOTIC MEDICATIONS IT IS NOT PERMISSIBLE TO OPERATE A MOTOR VEHICLE (MOTORCYCLE/CAR/TRUCK/MOPED) OR HEAVY MACHINERY DO NOT MIX NARCOTICS WITH OTHER CNS (CENTRAL NERVOUS SYSTEM) DEPRESSANTS SUCH AS ALCOHOL  Diet: as you were eating previously.  Can use over the counter stool softeners and bowel preparations, such as Miralax, to help with bowel movements.  Narcotics can be constipating.  Be sure to drink plenty of fluids    STOP SMOKING OR USING NICOTINE PRODUCTS!!!!  As discussed nicotine severely impairs your body's ability to heal surgical and traumatic wounds but also impairs bone healing.  Wounds and bone heal by forming  microscopic blood vessels (angiogenesis) and nicotine is a vasoconstrictor (essentially, shrinks blood vessels).  Therefore, if vasoconstriction occurs to these microscopic blood vessels they essentially disappear and are unable to deliver necessary nutrients to the healing tissue.  This is one modifiable factor that you can do to dramatically increase your chances of healing your injury.    (This means no smoking, no nicotine gum, patches, etc)  DO NOT USE NONSTEROIDAL ANTI-INFLAMMATORY DRUGS (NSAID'S)  Using products such as Advil (ibuprofen), Aleve (naproxen), Motrin (ibuprofen) for additional pain control during fracture healing can delay and/or prevent the healing response.  If you would like to take over the counter (OTC) medication, Tylenol (acetaminophen) is ok.  However, some narcotic medications that are given for pain control contain acetaminophen as well. Therefore, you should not exceed more than 4000 mg of tylenol in a day if you do not have liver disease.  Also note that there are may OTC medicines, such as cold medicines and allergy medicines that my contain tylenol as well.  If you have any questions about medications and/or interactions please ask your doctor/PA or your pharmacist.      ICE AND ELEVATE INJURED/OPERATIVE EXTREMITY  Using ice and elevating the injured extremity above your heart can help with swelling and pain control.  Icing in a pulsatile fashion, such as 20 minutes on and 20 minutes off, can be followed.    Do not place ice directly on skin. Make sure there is a barrier between to skin and the ice pack.    Using frozen items such as frozen peas works well  as the conform nicely to the are that needs to be iced.  USE AN ACE WRAP OR TED HOSE FOR SWELLING CONTROL  In addition to icing and elevation, Ace wraps or TED hose are used to help limit and resolve swelling.  It is recommended to use Ace wraps or TED hose until you are informed to stop.    When using Ace Wraps  start the wrapping distally (farthest away from the body) and wrap proximally (closer to the body)   Example: If you had surgery on your leg or thing and you do not have a splint on, start the ace wrap at the toes and work your way up to the thigh        If you had surgery on your upper extremity and do not have a splint on, start the ace wrap at your fingers and work your way up to the upper arm  IF YOU ARE IN A SPLINT OR CAST DO NOT REMOVE IT FOR ANY REASON   If your splint gets wet for any reason please contact the office immediately. You may shower in your splint or cast as long as you keep it dry.  This can be done by wrapping in a cast cover or garbage back (or similar)  Do Not stick any thing down your splint or cast such as pencils, money, or hangers to try and scratch yourself with.  If you feel itchy take benadryl as prescribed on the bottle for itching  IF YOU ARE IN A CAM BOOT (BLACK BOOT)  You may remove boot periodically. Perform daily dressing changes as noted below.  Wash the liner of the boot regularly and wear a sock when wearing the boot. It is recommended that you sleep in the boot until told otherwise  CALL THE OFFICE WITH ANY QUESTIONS OR CONCERNS: [phone]

## 2016-10-27 NOTE — Progress Notes (Signed)
PT Cancellation Note  Patient Details Name: Chelsea LacySilvia Lane MRN: [id number] DOB: 06-Feb-1976   Cancelled Treatment:    Reason Eval/Treat Not Completed: Other (comment);Pain limiting ability to participate.  Just got up to chair for OT evaluation and now declining PT.  Will try again later as time and pt allow.   Ivar DrapeRuth E Alleya Demeter 10/27/2016, 2:06 PM   Samul Dadauth Breyanna Valera, PT MS Acute Rehab Dept. Number: [facility] [phone] and [facility] [phone]
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
## 2016-10-27 NOTE — Plan of Care (Signed)
Problem: Acute Rehab PT Goals(only PT should resolve) Goal: Pt Will Perform Standing Balance Or Pre-Gait Maintaining NWB on LLE Goal: Pt Will Ambulate Maintaining NWB on LLE Goal: Pt Will Go Up/Down Stairs Maintaining NWB on LLE
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
## 2016-10-27 NOTE — Progress Notes (Signed)
PT Cancellation Note  Patient Details Name: Chelsea LacySilvia Gladhill MRN: [id number] DOB: January 29, 1976   Cancelled Treatment:    Reason Eval/Treat Not Completed: Other (comment).  Pt's family refused visit since she is asleep.  Will try later as time and pt allow.   Ivar DrapeRuth E Keeanna Villafranca 10/27/2016, 10:34 AM   Samul Dadauth Magally Vahle, PT MS Acute Rehab Dept. Number: [facility] [phone] and [facility] [phone]
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
## 2016-10-27 NOTE — Plan of Care (Signed)
Problem: Safety: Goal: Ability to remain free from injury will improve Outcome: Progressing Safety precautions maintained  Problem: Pain Managment: Goal: General experience of comfort will improve Outcome: Progressing Medicated earlier with Vicodin 2 tablets and robaxin 100 mg with moderate relief, resting in the bed with eyes closed at present time, no acute distress noted  Problem: Skin Integrity: Goal: Risk for impaired skin integrity will decrease Outcome: Progressing No skin breakdown or infection noted, dressing dry and intact to left foot  Problem: Tissue Perfusion: Goal: Risk factors for ineffective tissue perfusion will decrease Outcome: Progressing SCD on right leg, no S/S of DVT noted  Problem: Activity: Goal: Risk for activity intolerance will decrease Outcome: Not Progressing Resting in the bed this shift  Problem: Fluid Volume: Goal: Ability to maintain a balanced intake and output will improve Outcome: Progressing accurate amount of urine output noted  Problem: Bowel/Gastric: Goal: Will not experience complications related to bowel motility Outcome: Progressing No gastric or bowel issues reported

## 2016-10-28 LAB — CBC
HCT: 34.3 % — ABNORMAL LOW (ref 36.0–46.0)
Hemoglobin: 10.7 g/dL — ABNORMAL LOW (ref 12.0–15.0)
MCH: 25.7 pg — ABNORMAL LOW (ref 26.0–34.0)
MCHC: 31.2 g/dL (ref 30.0–36.0)
MCV: 82.5 fL (ref 78.0–100.0)
PLATELETS: 338 10*3/uL (ref 150–400)
RBC: 4.16 MIL/uL (ref 3.87–5.11)
RDW: 13.6 % (ref 11.5–15.5)
WBC: 12.2 10*3/uL — AB (ref 4.0–10.5)

## 2016-10-28 LAB — BASIC METABOLIC PANEL
Anion gap: 5 (ref 5–15)
BUN: 10 mg/dL (ref 6–20)
CALCIUM: 8.7 mg/dL — AB (ref 8.9–10.3)
CO2: 28 mmol/L (ref 22–32)
CREATININE: 0.42 mg/dL — AB (ref 0.44–1.00)
Chloride: 105 mmol/L (ref 101–111)
GFR calc Af Amer: >60 mL/min (ref >60)
Glucose, Bld: 99 mg/dL (ref 65–99)
Potassium: 4 mmol/L (ref 3.5–5.1)
SODIUM: 138 mmol/L (ref 135–145)

## 2016-10-28 NOTE — Progress Notes (Signed)
Subjective: 2 Days Post-Op Procedure(s) (LRB): OPEN REDUCTION INTERNAL FIXATION (ORIF) LEFT TIBIA FRACTURE (Left) INTRAMEDULLARY (IM) NAIL LEFT TIBIAL (Left) Patient reports pain as mild and moderate.  Family member in room for interpretation (spanish speaking)  Objective: Vital signs in last 24 hours: Temp:  [98.6 F (37 C)-99.4 F (37.4 C)] 98.6 F (37 C) (07/14 0446) Pulse Rate:  [69-86] 69 (07/14 0446) Resp:  [18] 18 (07/14 0446) BP: (117-127)/(73-80) 127/75 (07/14 0446) SpO2:  [98 %-100 %] 100 % (07/14 0446)  Intake/Output from previous day: 07/13 0701 - 07/14 0700 In: 2225 [P.O.:500; I.V.:1725] Out: -  Intake/Output this shift: No intake/output data recorded.   Recent Labs  10/26/16 1050 10/27/16 0320 10/28/16 0355  HGB 12.5 11.1* 10.7*    Recent Labs  10/27/16 0320 10/28/16 0355  WBC 12.1* 12.2*  RBC 4.32 4.16  HCT 34.7* 34.3*  PLT 382 338    Recent Labs  10/27/16 0320 10/28/16 0355  NA 136 138  K 4.2 4.0  CL 106 105  CO2 24 28  BUN 11 10  CREATININE 0.54 0.42*  GLUCOSE 177* 99  CALCIUM 8.6* 8.7*    Recent Labs  10/26/16 1050  INR 1.08    Neurovascular intact Incision: dressing C/D/I  Assessment/Plan: 2 Days Post-Op Procedure(s) (LRB): OPEN REDUCTION INTERNAL FIXATION (ORIF) LEFT TIBIA FRACTURE (Left) INTRAMEDULLARY (IM) NAIL LEFT TIBIAL (Left) Up with therapy Discharge home with home health today NWB LLE x6 weeks Ok for ROM L knee and hip, 2weeks for ankle ROM dsg change prn Pain control as ordered lovenox dvt proph   Chelsea Lane 10/28/2016, 8:11 AM
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
## 2016-10-28 NOTE — Progress Notes (Signed)
Physical Therapy Treatment Patient Details Name: Chelsea Lane MRN: [id number] DOB: 02-12-1976 Today's Date: 10/28/2016    History of Present Illness ORIF left tibial plateau fx    PT Comments    Pt demonstrates improved mobility and can tolerate standing at RW and stairs to perform stair negotiation. Pt will need to perform 5 steps to get into home. Pt has a lot of family support who will be there to assist. Pt is advised to use RW at home and [facility] when leaving home for longer distances. Pt will benefit from continued PT follow-up at discharge but will probably be very limited.     Follow Up Recommendations  Home health PT;Supervision for mobility/OOB     Equipment Recommendations  Other (comment) (has all equipment at home )    Recommendations for Other Services       Precautions / Restrictions Precautions Precautions: Fall Restrictions Weight Bearing Restrictions: Yes LLE Weight Bearing: Non weight bearing    Mobility  Bed Mobility Overal bed mobility: Needs Assistance Bed Mobility: Supine to Sit     Supine to sit: Min assist     General bed mobility comments: Min A to bring LLE EOB  Transfers Overall transfer level: Needs assistance Equipment used: Rolling walker (2 wheeled) Transfers: Sit to/from Stand Sit to Stand: Supervision         General transfer comment: stands from chair and maintains NWB for mult attempts with hands on arms of chair  Ambulation/Gait Ambulation/Gait assistance: Supervision Ambulation Distance (Feet): 20 Feet Assistive device: Rolling walker (2 wheeled) Gait Pattern/deviations: Step-to pattern;Antalgic Gait velocity: decreased Gait velocity interpretation: Below normal speed for age/gender General Gait Details: MIld antalgic gait, able to perform short distance gait to recliner   Stairs Stairs: Yes   Stair Management: Two rails;Step to pattern;Forwards Number of Stairs: 3 General stair comments: able to use railings and  ascend and descend stairs with supervision. Family will be avilable at discharge to assist.   Wheelchair Mobility    Modified Rankin (Stroke Patients Only)       Balance Overall balance assessment: Needs assistance Sitting-balance support: No upper extremity supported;Feet supported Sitting balance-Leahy Scale: Good     Standing balance support: Bilateral upper extremity supported;During functional activity Standing balance-Leahy Scale: Poor Standing balance comment: relaint on Bil UE support                            Cognition Arousal/Alertness: Awake/alert Behavior During Therapy: WFL for tasks assessed/performed Overall Cognitive Status: Within Functional Limits for tasks assessed                                 General Comments: husband helps with communication      Exercises      General Comments        Pertinent Vitals/Pain Pain Assessment: 0-10 Pain Score: 4  Pain Location: LLE Pain Descriptors / Indicators: Sore;Squeezing;Aching Pain Intervention(s): Monitored during session;Premedicated before session;Repositioned    Home Living                      Prior Function            PT Goals (current goals can now be found in the care plan section) Acute Rehab PT Goals Patient Stated Goal: home today Progress towards PT goals: Progressing toward goals    Frequency    Min  4X/week      PT Plan Current plan remains appropriate    Co-evaluation              AM-PAC PT "6 Clicks" Daily Activity  Outcome Measure  Difficulty turning over in bed (including adjusting bedclothes, sheets and blankets)?: None Difficulty moving from lying on back to sitting on the side of the bed? : None Difficulty sitting down on and standing up from a chair with arms (e.g., wheelchair, bedside commode, etc,.)?: A Little Help needed moving to and from a bed to chair (including a wheelchair)?: A Little Help needed walking in hospital  room?: A Little Help needed climbing 3-5 steps with a railing? : A Little 6 Click Score: 20    End of Session Equipment Utilized During Treatment: Gait belt Activity Tolerance: Patient tolerated treatment well Patient left: in chair;with call bell/phone within reach;with chair alarm set;with family/visitor present Nurse Communication: Mobility status PT Visit Diagnosis: Unsteadiness on feet (R26.81);Muscle weakness (generalized) (M62.81);Pain;Other abnormalities of gait and mobility (R26.89) Pain - Right/Left: Left Pain - part of body: Ankle and joints of foot     Time: 1000-1028 PT Time Calculation (min) (ACUTE ONLY): 28 min  Charges:  $Gait Training: 8-22 mins $Therapeutic Activity: 8-22 mins                    G Codes:       Colin BroachSabra M. Wyndi Lane PT, DPT  [phone]    Chelsea Lane 10/28/2016, 10:50 AM

## 2016-10-31 LAB — HEMOGLOBIN A1C
HEMOGLOBIN A1C: 5 % (ref 4.8–5.6)
Mean Plasma Glucose: 97 mg/dL
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
## 2018-09-21 IMAGING — DX DG ANKLE COMPLETE 3+V*L*
3 series · 3 of 3 positions shown · non-contrast
Comparison: Preoperative radiographs 10/21/2016

CLINICAL DATA: Postop.

EXAM:
LEFT ANKLE COMPLETE - 3+ VIEW

[ankle ap]
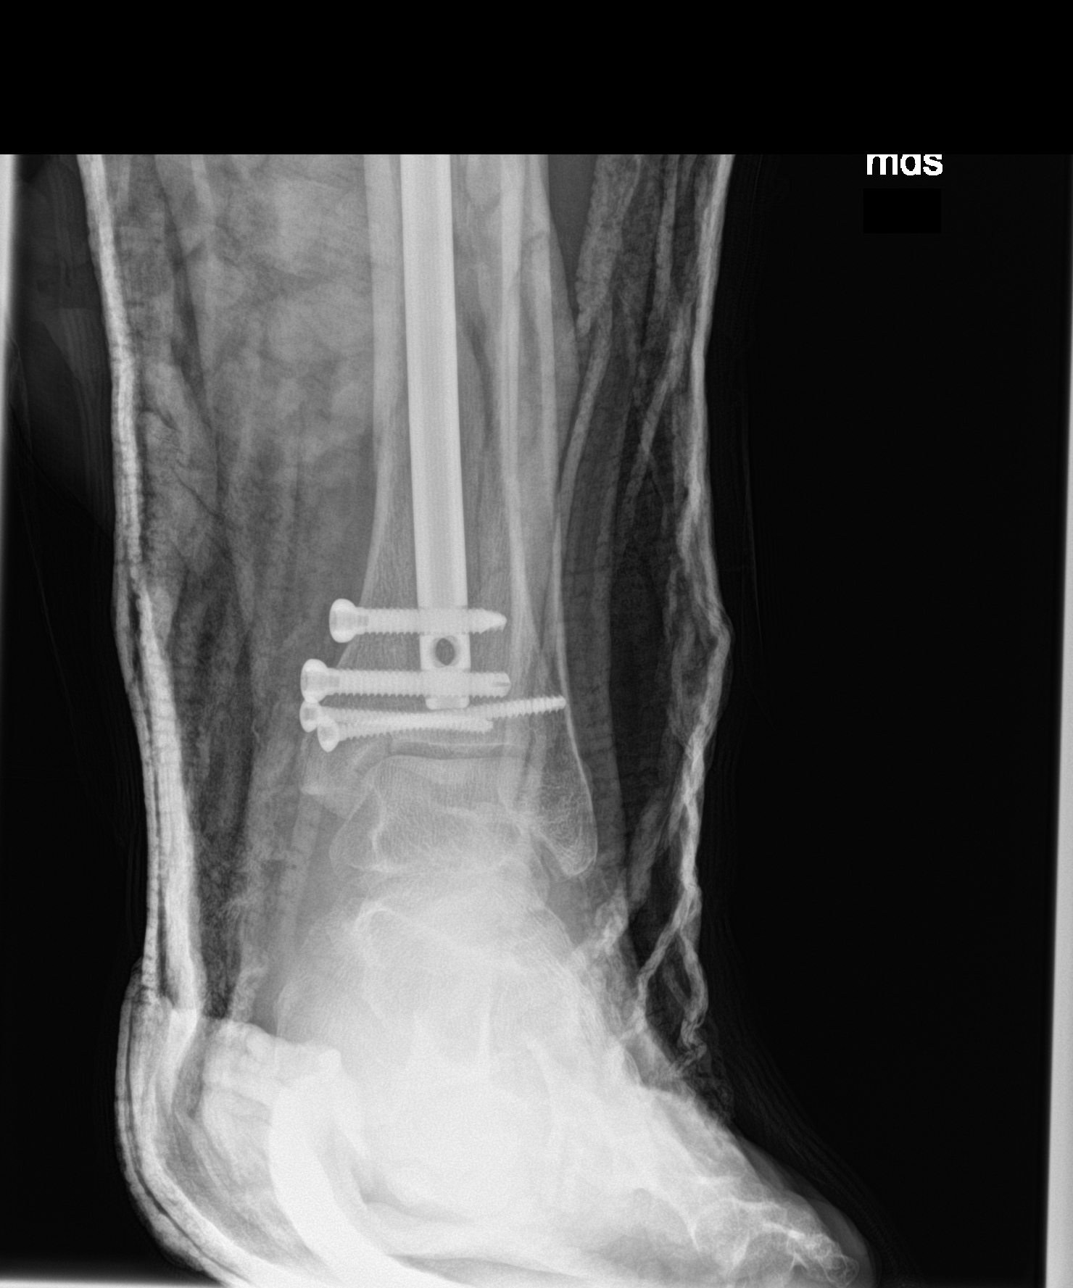

[ankle obl]
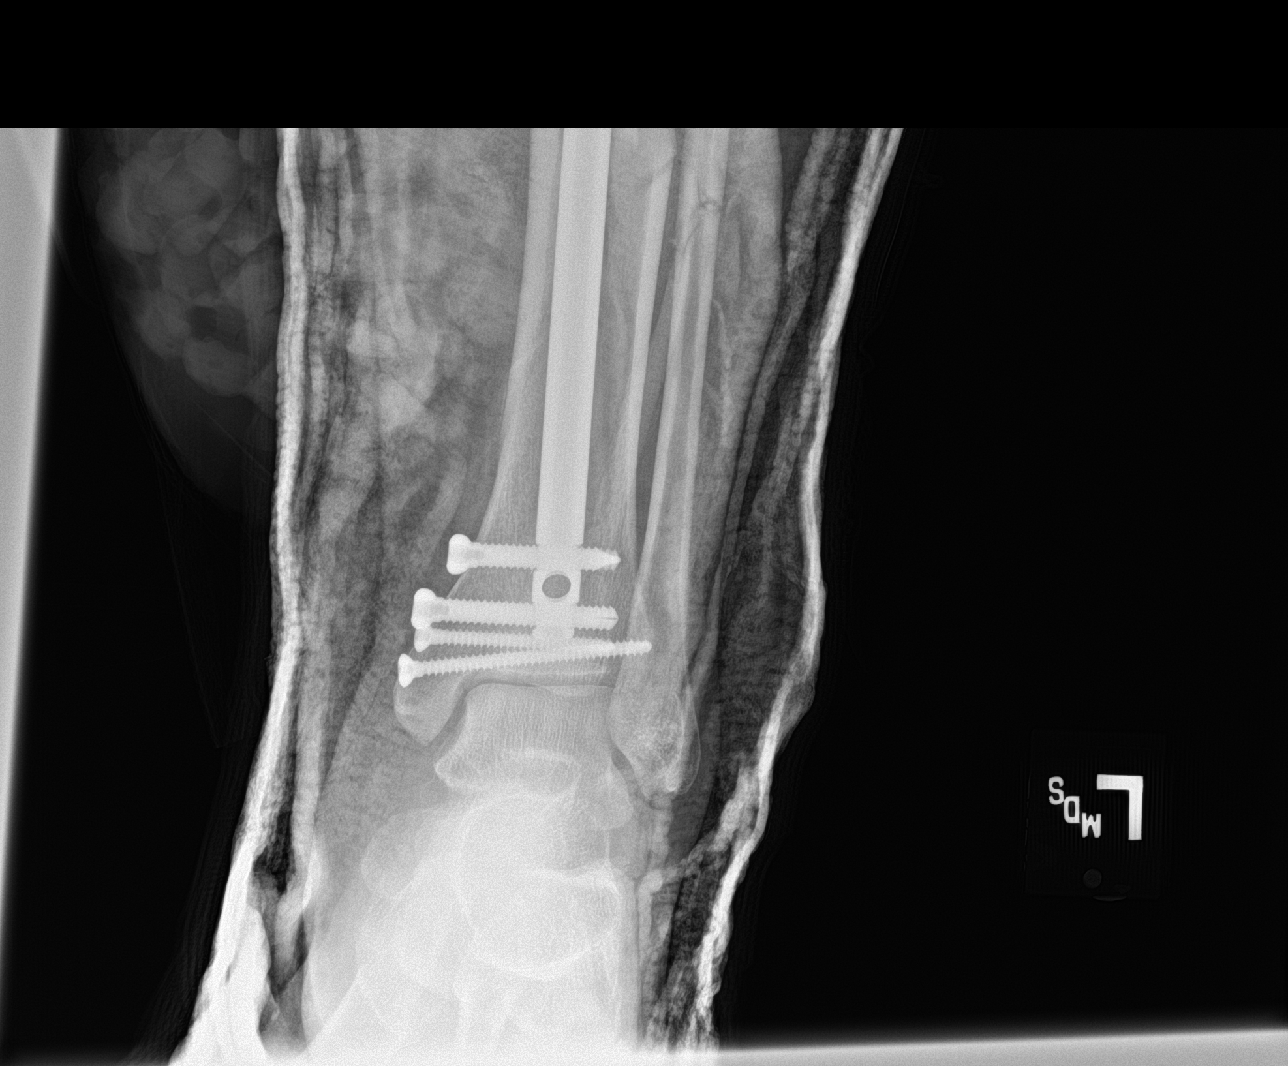

[ankle lat]
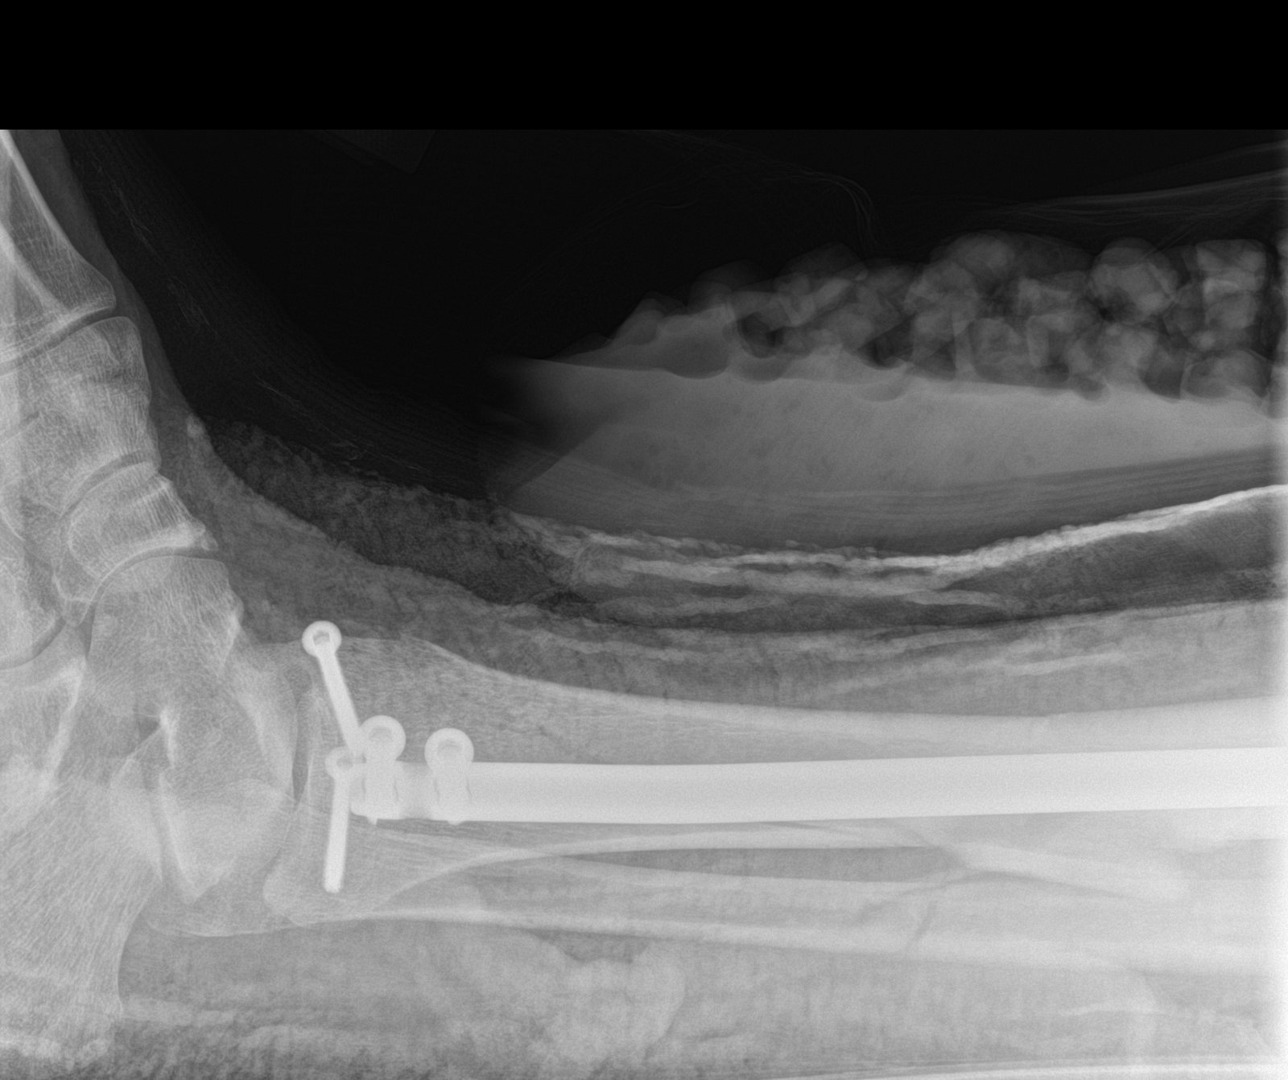

[3 of 3 positions shown; findings below may reference images not displayed]

FINDINGS: Intramedullary nail with multi screw fixation of comminuted distal
tibia fracture. No significant displacement of fracture fragments.
Distal fibular fracture is in near-anatomic alignment. Overlying
splint material in place obscuring osseous and soft tissue fine
detail.
IMPRESSION: Intramedullary nail and screw fixation of comminuted distal tibia
fracture. Fibular shaft fracture is in near-anatomic alignment.
